# Patient Record
Sex: Female | Born: 1980 | Race: White | Hispanic: No | Marital: Married | Smoking: Never smoker
Health system: Southern US, Community
[De-identification: ages and names within clinical notes are randomized; demographics above are authoritative.]

## PROBLEM LIST (undated history)

## (undated) DIAGNOSIS — Z789 Other specified health status: Secondary | ICD-10-CM

---

## 2004-07-30 ENCOUNTER — Emergency Department: Payer: Self-pay | Admitting: Emergency Medicine

## 2008-01-27 ENCOUNTER — Encounter: Payer: Self-pay | Admitting: Maternal and Fetal Medicine

## 2008-07-19 ENCOUNTER — Observation Stay: Payer: Self-pay

## 2008-07-22 ENCOUNTER — Inpatient Hospital Stay: Payer: Self-pay | Admitting: Obstetrics and Gynecology

## 2017-07-14 NOTE — L&D Delivery Note (Signed)
Delivery Note  Date of delivery: 02/05/2018 Estimated Date of Delivery: 01/31/18 Patient's last menstrual period was 05/07/2017. EGA: 255w5d  First Stage: Labor onset: 0300 02/05/18 Augmentation: AROM forebag, pitocin Analgesia Eliezer Lofts/Anesthesia intrapartum: IV Stadol, epidural SROM at 0300 02/05/18, AROM forebag 0855 02/05/18  Valerie Hanson presented to L&D with SROM labor. She was augmented with pitocin and AROM of forebag. Epidural placed for pain relief. She had recurrent variable and late decels, with IVF bolus, maternal oxygen, repositioning, and IUPC with AI used for resuscitation.   Second Stage: Complete dilation at 1840 Onset of pushing at 1845 FHR second stage: category II for variables and tachycardia Delivery at 1944 on 02/05/2018  She progressed to complete and had a spontaneous vaginal birth of a live female over an intact perineum. The fetal head was delivered in ROP position with restitution to LOT. One loose loop of cord around baby's right arm. Anterior then posterior shoulders delivered spontaneously. Baby placed on mom's abdomen and attended to by transition RN. Cord clamped and cut after 60 second delay by father of the baby. Cord segment given to nurse for gases. Cord blood obtained for newborn labs.  Third Stage: Placenta delivered spontaneously intact with 3VC at 1953 Placenta disposition: routine disposal Uterine tone firm / bleeding scant IV pitocin given for hemorrhage prophylyaxis  2nd degree perineal laceration identified  Anesthesia for repair: epidural Repair: 3-0 Vicryl Rapide CT Est. Blood Loss (mL): 250  Complications: none  Mom to postpartum.  Baby to Couplet care / Skin to Skin.  Newborn: Birth Weight: 7lb 15oz 3600g  Apgar Scores: 8, 9 Feeding planned: formula   Valerie Hanson, CNM 02/05/2018 8:26 PM

## 2017-07-21 ENCOUNTER — Other Ambulatory Visit: Payer: Self-pay | Admitting: Advanced Practice Midwife

## 2017-07-21 DIAGNOSIS — Z369 Encounter for antenatal screening, unspecified: Secondary | ICD-10-CM

## 2017-08-06 ENCOUNTER — Other Ambulatory Visit: Payer: Self-pay | Admitting: Advanced Practice Midwife

## 2017-08-06 ENCOUNTER — Ambulatory Visit
Admission: RE | Admit: 2017-08-06 | Discharge: 2017-08-06 | Disposition: A | Discharge status: Home / Self Care | Payer: BLUE CROSS/BLUE SHIELD | Source: Ambulatory Visit | Attending: Maternal & Fetal Medicine | Admitting: Maternal & Fetal Medicine

## 2017-08-06 ENCOUNTER — Ambulatory Visit (HOSPITAL_BASED_OUTPATIENT_CLINIC_OR_DEPARTMENT_OTHER)
Admission: RE | Admit: 2017-08-06 | Discharge: 2017-08-06 | Disposition: A | Discharge status: Home / Self Care | Payer: BLUE CROSS/BLUE SHIELD | Source: Ambulatory Visit | Attending: Maternal & Fetal Medicine | Admitting: Maternal & Fetal Medicine

## 2017-08-06 VITALS — BP 126/68 | HR 90 | Temp 99.1°F | Resp 18 | Wt 153.0 lb

## 2017-08-06 DIAGNOSIS — Z369 Encounter for antenatal screening, unspecified: Secondary | ICD-10-CM

## 2017-08-06 DIAGNOSIS — O09522 Supervision of elderly multigravida, second trimester: Secondary | ICD-10-CM | POA: Insufficient documentation

## 2017-08-06 DIAGNOSIS — Z3A14 14 weeks gestation of pregnancy: Secondary | ICD-10-CM | POA: Insufficient documentation

## 2017-08-06 HISTORY — DX: Other specified health status: Z78.9

## 2017-08-06 NOTE — Progress Notes (Signed)
Patient seen by me, I agree with the assessment and plan outlined by CGC Wells.  

## 2017-08-06 NOTE — Progress Notes (Signed)
Referring physician:  [facility] Length of Consultation: 40 minutes  Valerie Hanson was referred to [facility] for genetic counseling because of advanced maternal age.  The patient will be 37 years old at the time of delivery.  This note summarizes the information we discussed.    We explained that the chance of a chromosome abnormality increases with maternal age.  Chromosomes and examples of chromosome problems were reviewed.  Humans typically have 46 chromosomes in each cell, with half passed through each sperm and egg.  Any change in the number or structure of chromosomes can increase the risk of problems in the physical and mental development of a pregnancy.   Based upon age of the patient, the chance of any chromosome abnormality was 1 in 23. The chance of Down syndrome, the most common chromosome problem associated with maternal age, was 1 in 86.  The risk of chromosome problems is in addition to the 3% general population risk for birth defects and mental retardation.  The greatest chance, of course, is that the baby would be born in good health.  We discussed the following prenatal screening and testing options for this pregnancy:  First trimester screening, which includes nuchal translucency ultrasound screen and first trimester maternal serum marker screening.  The nuchal translucency has approximately an 80% detection rate for Down syndrome and can be positive for other chromosome abnormalities as well as heart defects.  When combined with a maternal serum marker screening, the detection rate is up to 90% for Down syndrome and up to 97% for trisomy 18.     The chorionic villus sampling procedure is available for first trimester chromosome analysis.  This involves the withdrawal of a small amount of chorionic villi (tissue from the developing placenta).  Risk of pregnancy loss is estimated to be approximately 1 in 200 to 1 in 100 (0.5 to 1%).  There is  approximately a 1% (1 in 100) chance that the CVS chromosome results will be unclear.  Chorionic villi cannot be tested for neural tube defects.     Maternal serum marker screening, a blood test that measures pregnancy proteins, can provide risk assessments for Down syndrome, trisomy 18, and open neural tube defects (spina bifida, anencephaly). Because it does not directly examine the fetus, it cannot positively diagnose or rule out these problems.  Targeted ultrasound uses high frequency sound waves to create an image of the developing fetus.  An ultrasound is often recommended as a routine means of evaluating the pregnancy.  It is also used to screen for fetal anatomy problems (for example, a heart defect) that might be suggestive of a chromosomal or other abnormality.   Amniocentesis involves the removal of a small amount of amniotic fluid from the sac surrounding the fetus with the use of a thin needle inserted through the maternal abdomen and uterus.  Ultrasound guidance is used throughout the procedure.  Fetal cells from amniotic fluid are directly evaluated and > 99.5% of chromosome problems and > 98% of open neural tube defects can be detected. This procedure is generally performed after the 15th week of pregnancy.  The main risks to this procedure include complications leading to miscarriage in less than 1 in 200 cases (0.5%).  We also reviewed the availability of cell free fetal DNA testing from maternal blood to determine whether or not the baby may have either Down syndrome, trisomy 59, or trisomy 77.  This test utilizes a maternal blood sample and DNA  sequencing technology to isolate circulating cell free fetal DNA from maternal plasma.  The fetal DNA can then be analyzed for DNA sequences that are derived from the three most common chromosomes involved in aneuploidy, chromosomes 13, 18, and 21.  If the overall amount of DNA is greater than the expected level for any of these chromosomes,  aneuploidy is suspected.  While we do not consider it a replacement for invasive testing and karyotype analysis, a negative result from this testing would be reassuring, though not a guarantee of a normal chromosome complement for the baby.  An abnormal result is certainly suggestive of an abnormal chromosome complement, though we would still recommend CVS or amniocentesis to confirm any findings from this testing.  Cystic Fibrosis and Spinal Muscular Atrophy (SMA) screening were also discussed with the patient. Both conditions are recessive, which means that both parents must be carriers in order to have a child with the disease.  Cystic fibrosis (CF) is one of the most common genetic conditions in persons of Caucasian ancestry.  This condition occurs in approximately 1 in 2,500 Caucasian persons and results in thickened secretions in the lungs, digestive, and reproductive systems.  For a baby to be at risk for having CF, both of the parents must be carriers for this condition.  Approximately 1 in 2625 Caucasian persons is a carrier for CF.  Current carrier testing looks for the most common mutations in the gene for CF and can detect approximately 90% of carriers in the Caucasian population.  This means that the carrier screening can greatly reduce, but cannot eliminate, the chance for an individual to have a child with CF.  If an individual is found to be a carrier for CF, then carrier testing would be available for the partner. As part of [place] newborn screening profile, all babies born in the state of [place] will have a two-tier screening process.  Specimens are first tested to determine the concentration of immunoreactive trypsinogen (IRT).  The top 5% of specimens with the highest IRT values then undergo DNA testing using a panel of over 40 common CF mutations. SMA is a neurodegenerative disorder that leads to atrophy of skeletal muscle and overall weakness.  This condition is also more  prevalent in the Caucasian population, with 1 in 40-1 in 60 persons being a carrier and 1 in 6,000-1 in 10,000 children being affected.  There are multiple forms of the disease, with some causing death in infancy to other forms with survival into adulthood.  The genetics of SMA is complex, but carrier screening can detect up to 95% of carriers in the Caucasian population.  Similar to CF, a negative result can greatly reduce, but cannot eliminate, the chance to have a child with SMA.  We obtained a detailed family history and pregnancy history.  The father of the baby reported one brother and one maternal uncle with kidney failure at a young age.  His brother required a kidney transplant at age 37 years.  He recalls the family was told the condition is inherited, but he does not have any information about the specific diagnosis or name for the condition. He knows that his kidneys are normal, as he had kidney stones and therefore had evaluations of his kidneys.  We discussed that there may be various types of kidney conditions with different types of inheritance, but that without additional medical information, a recurrence risk estimate for this pregnancy is difficult.  The remainder of the family history  is unremarkable for birth defects, developmental delays, recurrent pregnancy loss or known chromosome abnormalities.  Ms. Sameen Leas stated that this is her third pregnancy.  The couple has a healthy son and daughter.  She reported no complications or exposures that would be expected to increase the risk for birth defects in this pregnancy.  After consideration of the options, Ms. Solis Merkel elected to proceed with MaterniT21 PLUS with [facility].  She declined CF and SMA carrier screening.  Hemoglobinopathy screening was normal at [facility].  An ultrasound was performed at the time of the visit.  The gestational age was consistent with 14 weeks.  Fetal anatomy could not be assessed due to early gestational age.   Please refer to the ultrasound report for details of that study. She was scheduled to return in 4 weeks for a detailed anatomy ultrasound.  Ms. Sharina Petre was encouraged to call with questions or concerns.  We can be contacted at [phone].   Tests Ordered:  MaterniT21 PLUS with [facility]  Cherly Anderson, MS, CGC

## 2017-08-10 LAB — MATERNIT21 PLUS CORE+SCA
Chromosome 13: NEGATIVE
Chromosome 18: NEGATIVE
Chromosome 21: NEGATIVE
Y CHROMOSOME: NOT DETECTED

## 2017-08-13 ENCOUNTER — Telehealth: Payer: Self-pay | Admitting: Obstetrics and Gynecology

## 2017-08-13 NOTE — Telephone Encounter (Signed)
The patient was informed of the results of her recent MaterniT21 testing which yielded NEGATIVE results.  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  The sensitivity for trisomy 4221, trisomy 2518 and trisomy 2213 using this testing are reported as 99.1%, 99.9% and 91.7% respectively.  Thus, while the results of this testing are highly accurate, they are not considered diagnostic at this time.  Should more definitive information be desired, the patient may still consider amniocentesis.   As requested to know by the patient, sex chromosome analysis was included for this sample.  Results was consistent with a female fetus (no Y chromosome was detected). This is predicted with >99% accuracy.  A maternal serum AFP only should be considered if screening for neural tube defects is desired.   Cherly Andersoneborah F. Chaise Mahabir, MS, CGC

## 2017-08-31 ENCOUNTER — Other Ambulatory Visit: Payer: Self-pay | Admitting: *Deleted

## 2017-08-31 DIAGNOSIS — O09522 Supervision of elderly multigravida, second trimester: Secondary | ICD-10-CM

## 2017-09-03 ENCOUNTER — Ambulatory Visit
Admission: RE | Admit: 2017-09-03 | Discharge: 2017-09-03 | Disposition: A | Discharge status: Home / Self Care | Payer: BLUE CROSS/BLUE SHIELD | Source: Ambulatory Visit | Attending: Obstetrics & Gynecology | Admitting: Obstetrics & Gynecology

## 2017-09-03 VITALS — BP 117/67 | HR 88 | Temp 98.3°F | Resp 18 | Wt 155.6 lb

## 2017-09-03 DIAGNOSIS — O321XX0 Maternal care for breech presentation, not applicable or unspecified: Secondary | ICD-10-CM | POA: Insufficient documentation

## 2017-09-03 DIAGNOSIS — Z3A18 18 weeks gestation of pregnancy: Secondary | ICD-10-CM | POA: Diagnosis not present

## 2017-09-03 DIAGNOSIS — O09522 Supervision of elderly multigravida, second trimester: Secondary | ICD-10-CM | POA: Diagnosis present

## 2017-11-09 ENCOUNTER — Ambulatory Visit
Admission: RE | Admit: 2017-11-09 | Discharge: 2017-11-09 | Disposition: A | Discharge status: Home / Self Care | Payer: BLUE CROSS/BLUE SHIELD | Source: Ambulatory Visit | Attending: Maternal & Fetal Medicine | Admitting: Maternal & Fetal Medicine

## 2017-11-09 DIAGNOSIS — O09522 Supervision of elderly multigravida, second trimester: Secondary | ICD-10-CM

## 2017-11-09 DIAGNOSIS — Z3A28 28 weeks gestation of pregnancy: Secondary | ICD-10-CM | POA: Diagnosis not present

## 2018-02-02 ENCOUNTER — Other Ambulatory Visit: Payer: Self-pay | Admitting: Obstetrics and Gynecology

## 2018-02-02 ENCOUNTER — Other Ambulatory Visit: Payer: Self-pay | Admitting: Certified Nurse Midwife

## 2018-02-05 ENCOUNTER — Inpatient Hospital Stay: Payer: BLUE CROSS/BLUE SHIELD | Admitting: Anesthesiology

## 2018-02-05 ENCOUNTER — Other Ambulatory Visit: Payer: Self-pay

## 2018-02-05 ENCOUNTER — Inpatient Hospital Stay
Admission: EM | Admit: 2018-02-05 | Discharge: 2018-02-07 | DRG: 806 | Disposition: A | Discharge status: Home / Self Care | Payer: BLUE CROSS/BLUE SHIELD | Attending: Certified Nurse Midwife | Admitting: Certified Nurse Midwife

## 2018-02-05 DIAGNOSIS — D62 Acute posthemorrhagic anemia: Secondary | ICD-10-CM | POA: Diagnosis not present

## 2018-02-05 DIAGNOSIS — O480 Post-term pregnancy: Secondary | ICD-10-CM | POA: Diagnosis present

## 2018-02-05 DIAGNOSIS — O9081 Anemia of the puerperium: Secondary | ICD-10-CM | POA: Diagnosis not present

## 2018-02-05 DIAGNOSIS — Z3A40 40 weeks gestation of pregnancy: Secondary | ICD-10-CM | POA: Diagnosis not present

## 2018-02-05 DIAGNOSIS — Z7982 Long term (current) use of aspirin: Secondary | ICD-10-CM

## 2018-02-05 DIAGNOSIS — Z3483 Encounter for supervision of other normal pregnancy, third trimester: Secondary | ICD-10-CM | POA: Diagnosis present

## 2018-02-05 LAB — CBC
HEMATOCRIT: 36.8 % (ref 35.0–47.0)
HEMOGLOBIN: 12.5 g/dL (ref 12.0–16.0)
MCH: 30.3 pg (ref 26.0–34.0)
MCHC: 34.1 g/dL (ref 32.0–36.0)
MCV: 89.1 fL (ref 80.0–100.0)
Platelets: 258 10*3/uL (ref 150–440)
RBC: 4.13 MIL/uL (ref 3.80–5.20)
RDW: 14.8 % — ABNORMAL HIGH (ref 11.5–14.5)
WBC: 9.7 10*3/uL (ref 3.6–11.0)

## 2018-02-05 MED ORDER — COCONUT OIL OIL: 1.0000 "application " | TOPICAL_OIL | Status: DC | PRN

## 2018-02-05 MED ORDER — SENNOSIDES-DOCUSATE SODIUM 8.6-50 MG PO TABS
2.0000 | ORAL_TABLET | ORAL | Status: DC
  Administered 2018-02-06 – 2018-02-07 (×2): 2 via ORAL
  Filled 2018-02-05 (×3): qty 2

## 2018-02-05 MED ORDER — OXYTOCIN 10 UNIT/ML IJ SOLN
INTRAMUSCULAR | Status: AC
  Filled 2018-02-05: qty 2

## 2018-02-05 MED ORDER — WITCH HAZEL-GLYCERIN EX PADS: 1.0000 "application " | MEDICATED_PAD | CUTANEOUS | Status: DC | PRN

## 2018-02-05 MED ORDER — LACTATED RINGERS IV SOLN
500.0000 mL | INTRAVENOUS | Status: DC | PRN
  Administered 2018-02-05 (×2): 1000 mL via INTRAVENOUS

## 2018-02-05 MED ORDER — BUPIVACAINE HCL (PF) 0.25 % IJ SOLN
INTRAMUSCULAR | Status: DC | PRN
  Administered 2018-02-05 (×2): 5 mL via EPIDURAL

## 2018-02-05 MED ORDER — BUTORPHANOL TARTRATE 2 MG/ML IJ SOLN
1.0000 mg | INTRAMUSCULAR | Status: DC | PRN
  Administered 2018-02-05 (×2): 1 mg via INTRAVENOUS
  Filled 2018-02-05 (×2): qty 1

## 2018-02-05 MED ORDER — OXYTOCIN 40 UNITS IN LACTATED RINGERS INFUSION - SIMPLE MED
1.0000 m[IU]/min | INTRAVENOUS | Status: DC
  Administered 2018-02-05: 1 m[IU]/min via INTRAVENOUS

## 2018-02-05 MED ORDER — ONDANSETRON HCL 4 MG/2ML IJ SOLN: 4.0000 mg | Freq: Four times a day (QID) | INTRAMUSCULAR | Status: DC | PRN

## 2018-02-05 MED ORDER — FENTANYL 2.5 MCG/ML W/ROPIVACAINE 0.15% IN NS 100 ML EPIDURAL (ARMC)
EPIDURAL | Status: AC
  Filled 2018-02-05: qty 100

## 2018-02-05 MED ORDER — PRENATAL MULTIVITAMIN CH
1.0000 | ORAL_TABLET | Freq: Every day | ORAL | Status: DC
  Administered 2018-02-06: 1 via ORAL
  Filled 2018-02-05: qty 1

## 2018-02-05 MED ORDER — IBUPROFEN 600 MG PO TABS
600.0000 mg | ORAL_TABLET | Freq: Four times a day (QID) | ORAL | Status: DC
  Administered 2018-02-05 – 2018-02-07 (×7): 600 mg via ORAL
  Filled 2018-02-05 (×7): qty 1

## 2018-02-05 MED ORDER — MISOPROSTOL 25 MCG QUARTER TABLET: 25.0000 ug | ORAL_TABLET | ORAL | Status: DC

## 2018-02-05 MED ORDER — LIDOCAINE HCL (PF) 1 % IJ SOLN
INTRAMUSCULAR | Status: DC | PRN
  Administered 2018-02-05: 3 mL

## 2018-02-05 MED ORDER — OXYTOCIN 40 UNITS IN LACTATED RINGERS INFUSION - SIMPLE MED
2.5000 [IU]/h | INTRAVENOUS | Status: DC
  Administered 2018-02-05: 2.5 [IU]/h via INTRAVENOUS

## 2018-02-05 MED ORDER — AMMONIA AROMATIC IN INHA
RESPIRATORY_TRACT | Status: AC
  Filled 2018-02-05: qty 10

## 2018-02-05 MED ORDER — OXYTOCIN 40 UNITS IN LACTATED RINGERS INFUSION - SIMPLE MED
INTRAVENOUS | Status: AC
  Administered 2018-02-05: 1 m[IU]/min via INTRAVENOUS
  Filled 2018-02-05: qty 1000

## 2018-02-05 MED ORDER — FENTANYL 2.5 MCG/ML W/ROPIVACAINE 0.15% IN NS 100 ML EPIDURAL (ARMC)
EPIDURAL | Status: DC | PRN
  Administered 2018-02-05: 12 mL/h via EPIDURAL

## 2018-02-05 MED ORDER — ACETAMINOPHEN 325 MG PO TABS
650.0000 mg | ORAL_TABLET | ORAL | Status: DC | PRN
  Administered 2018-02-06 – 2018-02-07 (×4): 650 mg via ORAL
  Filled 2018-02-05 (×4): qty 2

## 2018-02-05 MED ORDER — OXYTOCIN BOLUS FROM INFUSION
500.0000 mL | Freq: Once | INTRAVENOUS | Status: AC
  Administered 2018-02-05: 500 mL via INTRAVENOUS

## 2018-02-05 MED ORDER — LIDOCAINE-EPINEPHRINE (PF) 1.5 %-1:200000 IJ SOLN
INTRAMUSCULAR | Status: DC | PRN
  Administered 2018-02-05: 3 mL via PERINEURAL

## 2018-02-05 MED ORDER — MISOPROSTOL 200 MCG PO TABS
ORAL_TABLET | ORAL | Status: AC
  Filled 2018-02-05: qty 4

## 2018-02-05 MED ORDER — DIBUCAINE 1 % RE OINT: 1.0000 "application " | TOPICAL_OINTMENT | RECTAL | Status: DC | PRN

## 2018-02-05 MED ORDER — SIMETHICONE 80 MG PO CHEW: 160.0000 mg | CHEWABLE_TABLET | Freq: Four times a day (QID) | ORAL | Status: DC | PRN

## 2018-02-05 MED ORDER — SOD CITRATE-CITRIC ACID 500-334 MG/5ML PO SOLN: 30.0000 mL | ORAL | Status: DC | PRN

## 2018-02-05 MED ORDER — LACTATED RINGERS AMNIOINFUSION
INTRAVENOUS | Status: DC
  Filled 2018-02-05 (×3): qty 1000

## 2018-02-05 MED ORDER — ONDANSETRON HCL 4 MG PO TABS: 4.0000 mg | ORAL_TABLET | ORAL | Status: DC | PRN

## 2018-02-05 MED ORDER — SODIUM CHLORIDE FLUSH 0.9 % IV SOLN
INTRAVENOUS | Status: AC
  Filled 2018-02-05: qty 10

## 2018-02-05 MED ORDER — LACTATED RINGERS IV SOLN
INTRAVENOUS | Status: DC
  Administered 2018-02-05 (×2): via INTRAVENOUS

## 2018-02-05 MED ORDER — ONDANSETRON HCL 4 MG/2ML IJ SOLN: 4.0000 mg | INTRAMUSCULAR | Status: DC | PRN

## 2018-02-05 MED ORDER — LIDOCAINE HCL (PF) 1 % IJ SOLN
INTRAMUSCULAR | Status: AC
  Filled 2018-02-05: qty 30

## 2018-02-05 MED ORDER — ACETAMINOPHEN 325 MG PO TABS: 650.0000 mg | ORAL_TABLET | ORAL | Status: DC | PRN

## 2018-02-05 MED ORDER — BENZOCAINE-MENTHOL 20-0.5 % EX AERO
1.0000 "application " | INHALATION_SPRAY | CUTANEOUS | Status: DC | PRN
  Filled 2018-02-05: qty 56

## 2018-02-05 MED ORDER — LIDOCAINE HCL (PF) 1 % IJ SOLN: 30.0000 mL | INTRAMUSCULAR | Status: DC | PRN

## 2018-02-05 NOTE — Discharge Summary (Signed)
Obstetric Discharge Summary   Patient ID: Patient Name: Valerie Hanson DOB: [birth year] MRN: [id number]  Date of Admission: 02/05/2018 Date of Delivery: 02/05/2018 Delivered by: Genia Del, CNM Date of Discharge: 02/07/2018  Primary OB: [facility] WUJ:[id number] last menstrual period was 05/07/2017. EDC Estimated Date of Delivery: 01/31/18 Gestational Age at Delivery: [redacted]w[redacted]d   Antepartum complications:  -Elevated 1h OGTT, normal 3h OTT -Hx of PreEclampsia: on baby ASA -AMA  Admitting Diagnosis: SROM labor  Secondary Diagnoses: Patient Active Problem List   Diagnosis Date Noted  . Post-term pregnancy, 40-42 weeks of gestation 02/05/2018  . Advanced maternal age in multigravida, second trimester     Augmentation: AROM and Pitocin Complications: None Intrapartum complications/course: Valerie Hanson presented to L&D with SROM labor. She was augmented with pitocin and AROM of forebag. Epidural placed for pain relief. She had recurrent variable and late decels, with IVF bolus, maternal oxygen, repositioning, and IUPC with AI used for resuscitation. She progressed to complete and had a spontaneous vaginal birth of a live female over an intact perineum. The fetal head was delivered in ROP position with restitution to LOT. One loose loop of cord around baby's right arm. Anterior then posterior shoulders delivered spontaneously. Baby placed on mom's abdomen and attended to by transition RN. Cord clamped and cut after 60 second delay by father of the baby. Cord segment given to nurse for gases. Cord blood obtained for newborn labs.  Delivery Type: spontaneous vaginal delivery Anesthesia: epidural Placenta: sponatneous Laceration: 2nd degree perineal, repaired Episiotomy: none  Newborn Data: Live born female "Valerie Hanson" Birth Weight: 7 lb 15 oz (3600 g) APGAR: 8, 9   Newborn Delivery   Birth date/time:  02/05/2018 19:44:00 Delivery type:  Vaginal, Spontaneous     Postpartum  Course  Patient had an uncomplicated postpartum course.  By time of discharge on PPD#2, her pain was controlled on oral pain medications; she had appropriate lochia and was ambulating, voiding without difficulty and tolerating regular diet.  She was deemed stable for discharge to home.       Labs: CBC Latest Ref Rng & Units 02/07/2018 02/06/2018 02/05/2018  WBC 3.6 - 11.0 K/uL 9.1 14.4(H) 9.7  Hemoglobin 12.0 - 16.0 g/dL 2.9(F) 10.5(L) 12.5  Hematocrit 35.0 - 47.0 % 28.3(L) 31.1(L) 36.8  Platelets 150 - 440 K/uL 186 215 258   O POS  Physical exam:  BP 99/69 (BP Location: Left Arm)   Pulse 81   Temp 98.2 F (36.8 C) (Oral)   Resp 20   Ht 5' (1.524 m)   Wt 78.5 kg (173 lb)   LMP 05/07/2017   SpO2 99%   Breastfeeding? Unknown   BMI 33.79 kg/m  General: alert and no distress Pulm: normal respiratory effort Lochia: appropriate Abdomen: soft, NT Uterine Fundus: firm, below umbilicus Extremities: No evidence of DVT seen on physical exam. No lower extremity edema.  Disposition: stable, discharge to home Baby Feeding: formula Baby Disposition: home with mom  Contraception: TBD, considering BTL  Prenatal Labs:  Blood type/Rh O+  Antibody screen neg  Rubella Immune  Varicella Immune  RPR NR  HBsAg Neg  HIV NR  GC neg  Chlamydia neg  Genetic screening negative  1 hour GTT 174  3 hour GTT 80, 161, 108,74  GBS negative    Rh Immune globulin given: n/a Rubella vaccine given: n/a Tdap vaccine given in AP or PP setting: 11/16/2017 Flu vaccine given in AP or PP setting: 07/21/2017  Plan:  [name]  [name] was discharged to home in good condition. [facility] with delivery provider in 6 weeks  Discharge Instructions: Per After Visit Summary. Activity: Advance as tolerated. Pelvic rest for 6 weeks.   Diet: Regular Discharge Medications: Allergies as of 02/07/2018   No Known Allergies     Medication List    TAKE these medications    acetaminophen 325 MG tablet Commonly known as:  TYLENOL Take 2 tablets (650 mg total) by mouth every 4 (four) hours as needed for mild pain or moderate pain.   aspirin 81 MG chewable tablet Chew by mouth daily.   ferrous sulfate 325 (65 FE) MG tablet Take 1 tablet (325 mg total) by mouth daily with breakfast.   ibuprofen 600 MG tablet Commonly known as:  ADVIL,MOTRIN Take 1 tablet (600 mg total) by mouth every 6 (six) hours as needed for mild pain, moderate pain or cramping.   prenatal multivitamin Tabs tablet Take 1 tablet by mouth daily at 12 noon.      Outpatient follow up:  Follow-up Information    Genia DelHaviland, Jhonathan Desroches, CNM. Schedule an appointment as soon as possible for a visit in 6 week(s).   Specialty:  Certified Nurse Midwife Why:  For routine postpartum visit Contact information: [address] [place] [address] [phone]           Signed:  Genia DelMargaret Lulamae Skorupski, CNM 02/07/2018 8:26 AM

## 2018-02-05 NOTE — Anesthesia Preprocedure Evaluation (Signed)
Anesthesia Evaluation  Patient identified by MRN, date of birth, ID band Patient awake    Reviewed: Allergy & Precautions, H&P , NPO status , Patient's Chart, lab work & pertinent test results  History of Anesthesia Complications Negative for: history of anesthetic complications  Airway Mallampati: II       Dental no notable dental hx.    Pulmonary neg pulmonary ROS,    Pulmonary exam normal        Cardiovascular negative cardio ROS Normal cardiovascular exam     Neuro/Psych negative neurological ROS  negative psych ROS   GI/Hepatic negative GI ROS, Neg liver ROS,   Endo/Other  negative endocrine ROS  Renal/GU negative Renal ROS  negative genitourinary   Musculoskeletal   Abdominal   Peds  Hematology negative hematology ROS (+)   Anesthesia Other Findings   Reproductive/Obstetrics (+) Pregnancy                             Anesthesia Physical Anesthesia Plan  ASA: II  Anesthesia Plan:    Post-op Pain Management:    Induction:   PONV Risk Score and Plan:   Airway Management Planned:   Additional Equipment:   Intra-op Plan:   Post-operative Plan:   Informed Consent: I have reviewed the patients History and Physical, chart, labs and discussed the procedure including the risks, benefits and alternatives for the proposed anesthesia with the patient or authorized representative who has indicated his/her understanding and acceptance.     Plan Discussed with: CRNA  Anesthesia Plan Comments:         Anesthesia Quick Evaluation

## 2018-02-05 NOTE — Progress Notes (Signed)
Labor Progress Note  Valerie Hanson is a 37 y.o. G3P2002 at 4220w5d by 8247w4d ultrasound admitted for rupture of membranes  Subjective:  Feeling pain from contractions. Desires another dose of Stadol and waiting for it to come up from pharmacy.   Objective: BP 112/76 (BP Location: Left Arm)   Pulse 79   Temp 98.4 F (36.9 C) (Oral)   Resp 16   Ht 5' (1.524 m)   Wt 78.5 kg (173 lb)   LMP 05/07/2017   SpO2 98%   BMI 33.79 kg/m   Fetal Assessment: FHT:  FHR: 135 bpm, variability: moderate,  accelerations:  Present,  decelerations: Present early, variable, late Category/reactivity:  Category I and Category II UC:   regular, every 2-4 minutes SVE:  Per RN Jacqlyn LarsenBrittney Nielsen at 1245 Dilation: 7cm  Effacement: 70%  Station:  -2  Consistency: medium  Position: middle  Membrane status: SROM 02/05/18 0300, AROM forebag 02/05/18 0855 Amniotic color: clear  Labs: Lab Results  Component Value Date   WBC 9.7 02/05/2018   HGB 12.5 02/05/2018   HCT 36.8 02/05/2018   MCV 89.1 02/05/2018   PLT 258 02/05/2018    Assessment / Plan: Spontaneous labor, progressing normally  Labor: Progressing slowly, Pitocin currently infusing at 427mu/min, continue to titrate per protocol  Fetal Wellbeing:  Category II, overall reassuring, but will give IVF bolus now and oxygen PRN Pain Control:  IV pain meds Anticipated MOD:  NSVD  Genia DelMargaret Damonte Frieson, CNM 02/05/2018, 1:06 PM

## 2018-02-05 NOTE — Progress Notes (Signed)
Labor Progress Note  Valerie Hanson is a 37 y.o. G3P2002 at 1639w5d by 6364w4d ultrasound admitted for rupture of membranes  Subjective:  Feeling a lot of relief with the epidural in place. Has one patch where she is feeling pain, but is overall feeling much better. Laying on right side with peanut ball in place.   Objective: BP 107/67   Pulse 87   Temp 98.5 F (36.9 C) (Oral)   Resp 16   Ht 5' (1.524 m)   Wt 78.5 kg (173 lb)   LMP 05/07/2017   SpO2 100%   BMI 33.79 kg/m   Fetal Assessment: FHT: 135 bpm, variability: moderate,  accelerations:  Present,  decelerations: Present variable, late Category/reactivity:  Category II UC:   regular, every 2-4 minutes SVE:  Per RN Jacqlyn LarsenBrittney Nielsen at 1245 Dilation: 8.5cm  Effacement: 90%  Station:  -1  Consistency: soft  Position: middle  Membrane status: SROM 02/05/18 0300, AROM forebag 02/05/18 0855 Amniotic color: clear  Labs: Lab Results  Component Value Date   WBC 9.7 02/05/2018   HGB 12.5 02/05/2018   HCT 36.8 02/05/2018   MCV 89.1 02/05/2018   PLT 258 02/05/2018    Assessment / Plan: Augmentation of labor, prolonged active phase  Labor: Progressing slowly, Pitocin currently infusing at 254mu/min, stopped and restarted at half after earlier prolonged decel, IUPC placed to titrate pitocin Fetal Wellbeing:  Category II, overall reassuring, but with 4 minute prolonged deceleration and regular variable decles Pain Control:  Epidural Anticipated MOD:  NSVD   Dr. Dalbert GarnetBeasley aware of current plan.   Genia DelMargaret Tonetta Napoles, CNM 02/05/2018, 4:31 PM

## 2018-02-05 NOTE — H&P (Signed)
OB ADMISSION/ HISTORY & PHYSICAL:  Admission Date: 02/05/2018  3:55 AM  Admit Diagnosis: SROM  Valerie Hanson is a 37 y.o. female presenting for active labor with ruptured membranes.  Prenatal History: [id number] EDC : 01/31/2018, by Ultrasound  Prenatal care at [facility] Prenatal course complicated by  Abnormal glucola, normal 3 hr Hx of PreEclampsia: on baby ASA   Medical / Surgical History :  Past medical history:  Past Medical History:  Diagnosis Date  . Medical history non-contributory      Past surgical history: History reviewed. No pertinent surgical history.  Family History: No family history on file.   Social History:  reports that she has never smoked. She has never used smokeless tobacco. She reports that she does not drink alcohol or use drugs.   Allergies: Patient has no known allergies.    Current Medications at time of admission:  Prior to Admission medications   Medication Sig Start Date End Date Taking? Authorizing Provider  aspirin 81 MG chewable tablet Chew by mouth daily.   Yes [provider]  Prenatal Vit-Fe Fumarate-FA (PRENATAL MULTIVITAMIN) TABS tablet Take 1 tablet by mouth daily at 12 noon.   Yes [provider]     Review of Systems: Active FM onset of ctx @ 3am currently every 3 minutes LOF  / SROM: clear fluid at 0300 bloody show yes   Physical Exam:  VS: Blood pressure 117/67, pulse 86, temperature 98.4 F (36.9 C), temperature source Oral, resp. rate 16, height 5' (1.524 m), weight 78.5 kg (173 lb), last menstrual period 05/07/2017.  General: alert and oriented, appears in mod distress Heart: RRR Lungs: Clear lung fields Abdomen: Gravid, soft and non-tender, non-distended / uterus: non tender Extremities: no edema  FHT: 120, moderate variability, +accels, no decels TOCO:q3 min SVE:  Dilation: 4 / Effacement (%): 70 / Station: -2    Cephalic by leopolds  Prenatal Labs: Blood type/Rh    Antibody screen  neg  Rubella Immune  Varicella Immune  RPR NR  HBsAg Neg  HIV NR  GC neg  Chlamydia neg  Genetic screening negative  1 hour GTT 174  3 hour GTT 80, 161, 108,74  GBS negative   No results found.  Assessment: 40+[redacted] weeks gestation 1 stage of labor FHR category 1   Plan:   Admit for active labor Labs pending Epidural if desired Continuous fetal monitoring   1. Fetal Well being  - Fetal Tracing: Cat I - Group B Streptococcus: neg - Presentation: vtx confirmed by Leopolds   2. Routine OB: - Prenatal labs reviewed, as above - Rh O positive   3. Post Partum Planning: - Infant feeding: Formula - Contraception: Postpartum BTL

## 2018-02-05 NOTE — Progress Notes (Signed)
Labor Progress Note  Valerie KatzMaria D Solis Hanson is a 37 y.o. G3P2002 at 7050w5d by 7638w4d ultrasound admitted for rupture of membranes  Subjective:  Laying on left side with peanut ball in place.   Objective: BP 107/67   Pulse 87   Temp 98 F (36.7 C) (Oral)   Resp 16   Ht 5' (1.524 m)   Wt 78.5 kg (173 lb)   LMP 05/07/2017   SpO2 95%   BMI 33.79 kg/m   Fetal Assessment: FHT: 135 bpm, variability: moderate,  accelerations:  Present,  decelerations: Present variable, late Category/reactivity:  Category II UC:   regular, every 3-7 minutes SVE:  Per RN Jacqlyn LarsenBrittney Nielsen at 1245 Dilation: 9cm  Effacement: 100%  Station:  0  Consistency: soft  Position: middle  Membrane status: SROM 02/05/18 0300, AROM forebag 02/05/18 0855 Amniotic color: clear  Labs: Lab Results  Component Value Date   WBC 9.7 02/05/2018   HGB 12.5 02/05/2018   HCT 36.8 02/05/2018   MCV 89.1 02/05/2018   PLT 258 02/05/2018    Assessment / Plan: Augmentation of labor, prolonged active phase  Labor: Progressing slowly, Pitocin turned off due to recurrent decelerations, IUPC remains in place Fetal Wellbeing:  Category II, overall reassuring, recurrent decels improved with cessation of Pitocin, amnioinfusion of 250mL with continuous infusion of 16525mL/hr, 10L O2 by mask Pain Control:  Epidural Anticipated MOD:  NSVD    Discussed patient and tracing with Dr. Dalbert GarnetBeasley, who states that she will come to evaluate the patient and tracing in person as soon as she is out of the operating room downstairs.    Genia DelMargaret Selim Durden, CNM 02/05/2018, 5:54 PM

## 2018-02-05 NOTE — Anesthesia Procedure Notes (Signed)
Epidural Patient location during procedure: OB Start time: 02/05/2018 2:08 PM End time: 02/05/2018 2:24 PM  Staffing Resident/CRNA: Junious SilkNoles, Marisol Giambra, CRNA Performed: resident/CRNA   Preanesthetic Checklist Completed: patient identified, site marked, surgical consent, pre-op evaluation, timeout performed, IV checked, risks and benefits discussed and monitors and equipment checked  Epidural Patient position: sitting Prep: Betadine Patient monitoring: heart rate, continuous pulse ox and blood pressure Approach: midline Location: L3-L4 Injection technique: LOR saline  Needle:  Needle type: Tuohy  Needle gauge: 17 G Needle length: 9 cm and 9 Catheter type: closed end flexible Catheter size: 20 Guage Test dose: negative and 1.5% lidocaine with Epi 1:200 K  Assessment Events: blood not aspirated, injection not painful, no injection resistance, negative IV test and no paresthesia  Additional Notes   Patient tolerated the insertion well without complications.Reason for block:procedure for pain

## 2018-02-05 NOTE — Plan of Care (Signed)
Reviewed plan of care with patient. All questions answered. Will continue to monitor closely. 

## 2018-02-05 NOTE — Progress Notes (Signed)
Labor Progress Note  Valerie Hanson is a 37 y.o. G3P2002 at 4125w5d by 5823w4d ultrasound admitted for rupture of membranes  Subjective:  Feeling contractions strongly, got some relief from IV Stadol.   Objective: BP 108/77   Pulse 86   Temp 97.8 F (36.6 C) (Oral)   Resp 16   Ht 5' (1.524 m)   Wt 78.5 kg (173 lb)   LMP 05/07/2017   SpO2 98%   BMI 33.79 kg/m   Fetal Assessment: FHT:  FHR: 125 bpm, variability: moderate,  accelerations:  Present,  decelerations:  Present early Category/reactivity:  Category I UC:   regular, every 4-6 minutes SVE:    Dilation: 6cm  Effacement: 70%  Station:  -2  Consistency: medium  Position: middle  Membrane status: SROM 02/05/18 0300, AROM forebag 02/05/18 0855 Amniotic color: clear  Labs: Lab Results  Component Value Date   WBC 9.7 02/05/2018   HGB 12.5 02/05/2018   HCT 36.8 02/05/2018   MCV 89.1 02/05/2018   PLT 258 02/05/2018    Assessment / Plan: Spontaneous labor, progressing normally  Labor: Progressing normally but with contractions spacing out, encouraged to be upright and move around, plan to start Pitocin if contractions continue to space Fetal Wellbeing:  Category I Pain Control:  IV pain meds Anticipated MOD:  NSVD  Genia DelMargaret Modine Oppenheimer, CNM 02/05/2018, 8:58 AM

## 2018-02-05 NOTE — Progress Notes (Signed)
Labor Progress Note  Valerie Hanson is a 37 y.o. G3P2002 at 7684w5d by 7891w4d ultrasound admitted for rupture of membranes  Subjective:  Feeling a lot of relief with the epidural in place. Has one patch where she is feeling pain, but is overall feeling much better. Laying on right side with peanut ball in place.   Objective: BP 107/67   Pulse 87   Temp 98.5 F (36.9 C) (Oral)   Resp 16   Ht 5' (1.524 m)   Wt 78.5 kg (173 lb)   LMP 05/07/2017   SpO2 95%   BMI 33.79 kg/m   Fetal Assessment: FHT: 135 bpm, variability: moderate,  accelerations:  Present,  decelerations: Present variable, late Category/reactivity:  Category II UC:   regular, every 2-4 minutes SVE:  Per RN Valerie LarsenBrittney Hanson at 1245 Dilation: 9cm  Effacement: 100%  Station:  0  Consistency: soft  Position: middle  Membrane status: SROM 02/05/18 0300, AROM forebag 02/05/18 0855 Amniotic color: clear  Labs: Lab Results  Component Value Date   WBC 9.7 02/05/2018   HGB 12.5 02/05/2018   HCT 36.8 02/05/2018   MCV 89.1 02/05/2018   PLT 258 02/05/2018    Assessment / Plan: Augmentation of labor, prolonged active phase  Labor: Progressing slowly, Pitocin turned off due to recurrent decelerations, IUPC remains in place Fetal Wellbeing:  Category II, overall reassuring, recurrent decels improved with cessation of Pitocin, amnioinfusion of 250mL with continuous infusion of 17025mL/hr Pain Control:  Epidural Anticipated MOD:  NSVD    Valerie Hanson, CNM 02/05/2018, 5:19 PM

## 2018-02-05 NOTE — OB Triage Note (Signed)
Pt arrival to triage with c/o SROM around 0300.  Nitrazine positive.  Pt grossly ruptured with clear large amount of fluid.  Pt states she feels pressure and mild contractions.  Denies vaginal bleeding and is feeling baby move normally.  EFM and toco applied and assessing.

## 2018-02-06 LAB — TYPE AND SCREEN
ABO/RH(D): O POS
Antibody Screen: NEGATIVE

## 2018-02-06 LAB — CBC
HEMATOCRIT: 31.1 % — AB (ref 35.0–47.0)
Hemoglobin: 10.5 g/dL — ABNORMAL LOW (ref 12.0–16.0)
MCH: 30.2 pg (ref 26.0–34.0)
MCHC: 33.8 g/dL (ref 32.0–36.0)
MCV: 89.3 fL (ref 80.0–100.0)
Platelets: 215 10*3/uL (ref 150–440)
RBC: 3.48 MIL/uL — AB (ref 3.80–5.20)
RDW: 14.9 % — ABNORMAL HIGH (ref 11.5–14.5)
WBC: 14.4 10*3/uL — AB (ref 3.6–11.0)

## 2018-02-06 MED ORDER — FERROUS SULFATE 325 (65 FE) MG PO TABS
325.0000 mg | ORAL_TABLET | Freq: Every day | ORAL | Status: DC
  Administered 2018-02-06 – 2018-02-07 (×2): 325 mg via ORAL
  Filled 2018-02-06 (×2): qty 1

## 2018-02-06 NOTE — Progress Notes (Signed)
Post Partum Day 1  Subjective: Doing well, no concerns. Ambulating without difficulty, pain managed with PO meds, tolerating regular diet, and voiding without difficulty.   No fever/chills, chest pain, shortness of breath, nausea/vomiting, or leg pain. No nipple or breast pain.   Objective: BP 106/65 (BP Location: Left Arm)   Pulse 97   Temp 98.8 F (37.1 C) (Oral)   Resp 20   Ht 5' (1.524 m)   Wt 78.5 kg (173 lb)   LMP 05/07/2017   SpO2 99%   Breastfeeding? Unknown   BMI 33.79 kg/m    Physical Exam:  General: alert, cooperative, appears stated age and no distress Breasts: soft/nontender CV: RRR Pulm: nl effort, CTABL Abdomen: soft, non-tender, active bowel sounds Uterine Fundus: firm Lochia: appropriate DVT Evaluation: No evidence of DVT seen on physical exam. No cords or calf tenderness. No significant calf/ankle edema.  Recent Labs    02/05/18 0540 02/06/18 0534  HGB 12.5 10.5*  HCT 36.8 31.1*  WBC 9.7 14.4*  PLT 258 215    Assessment/Plan: 37 y.o. G3P3003 postpartum day # 1  -Continue routine PP care -Encouraged snug fitting bra and cabbage leaves for bottlefeeding.  -Considering BTL for contraception -Acute blood loss anemia - hemodynamically stable and asymptomatic, start daily iron supplement with stool softeners. -Immunization status: all immunizations up to date. -Plan for discharge home tomorrow morning.  Disposition: Continue inpatient postpartum care.     LOS: 1 day   Genia DelMargaret Tattianna Schnarr, CNM 02/06/2018, 9:00 AM   ----- Genia DelMargaret Aldena Worm Certified Nurse Midwife [facility]

## 2018-02-06 NOTE — Anesthesia Postprocedure Evaluation (Signed)
Anesthesia Post Note  Patient: Valerie Hanson  Procedure(s) Performed: AN AD HOC LABOR EPIDURAL  Patient location during evaluation: Mother Baby Anesthesia Type: Epidural Level of consciousness: awake and alert Pain management: pain level controlled Vital Signs Assessment: post-procedure vital signs reviewed and stable Respiratory status: spontaneous breathing, nonlabored ventilation and respiratory function stable Cardiovascular status: stable Postop Assessment: no headache, no backache, patient able to bend at knees and able to ambulate Anesthetic complications: no     Last Vitals:  Vitals:   02/06/18 0412 02/06/18 0841  BP: (!) 97/57 106/65  Pulse: 80 97  Resp: 16 20  Temp: 36.6 C 37.1 C  SpO2: 98% 99%    Last Pain:  Vitals:   02/06/18 0855  TempSrc:   PainSc: 6                  Cleda MccreedyJoseph K Oronde Hallenbeck

## 2018-02-06 NOTE — Plan of Care (Signed)
Discussed plan of care with patient. Patient verbalized understanding and agreed to plan. 

## 2018-02-07 LAB — RPR: RPR Ser Ql: NONREACTIVE

## 2018-02-07 LAB — CBC
HCT: 28.3 % — ABNORMAL LOW (ref 35.0–47.0)
HEMOGLOBIN: 9.6 g/dL — AB (ref 12.0–16.0)
MCH: 30.6 pg (ref 26.0–34.0)
MCHC: 34 g/dL (ref 32.0–36.0)
MCV: 89.8 fL (ref 80.0–100.0)
Platelets: 186 10*3/uL (ref 150–440)
RBC: 3.15 MIL/uL — AB (ref 3.80–5.20)
RDW: 15 % — ABNORMAL HIGH (ref 11.5–14.5)
WBC: 9.1 10*3/uL (ref 3.6–11.0)

## 2018-02-07 MED ORDER — FERROUS SULFATE 325 (65 FE) MG PO TABS: 325.0000 mg | ORAL_TABLET | Freq: Every day | ORAL | 0 refills | Status: AC

## 2018-02-07 MED ORDER — IBUPROFEN 600 MG PO TABS: 600.0000 mg | ORAL_TABLET | Freq: Four times a day (QID) | ORAL | 0 refills | Status: AC | PRN

## 2018-02-07 MED ORDER — ACETAMINOPHEN 325 MG PO TABS: 650.0000 mg | ORAL_TABLET | ORAL | 0 refills | Status: AC | PRN

## 2018-02-07 NOTE — Progress Notes (Signed)
Provided and reviewed discharge paperwork and prescriptions. Used teach back method, and pt verbalized understanding of instructions provided. Pt is to make follow appointment tomorrow for 6 week postpartum check up. Mother to be discharged in wheelchair with infant in car seat, father of baby to transport home.

## 2018-02-07 NOTE — Discharge Instructions (Signed)

## 2019-08-04 ENCOUNTER — Other Ambulatory Visit: Payer: Self-pay

## 2019-08-04 DIAGNOSIS — Z20822 Contact with and (suspected) exposure to covid-19: Secondary | ICD-10-CM | POA: Insufficient documentation

## 2019-08-05 ENCOUNTER — Telehealth: Payer: Self-pay

## 2019-08-05 ENCOUNTER — Telehealth: Payer: Self-pay | Admitting: *Deleted

## 2019-08-05 LAB — NOVEL CORONAVIRUS, NAA: SARS-CoV-2, NAA: NOT DETECTED

## 2019-09-04 IMAGING — US US MFM OB FOLLOW-UP
1 series · 13 of 28 positions shown · non-contrast
Comparison: none

PATIENT INFO:

PERFORMED BY:
SERVICE(S) PROVIDED:
INDICATIONS:
28 weeks gestation of pregnancy
FETAL EVALUATION:
Num Of Fetuses:     1
Fetal Heart         149
Rate(bpm):
Cardiac Activity:   Present
Presentation:       Cephalic
Placenta:           Posterior
AFI Sum(cm)     %Tile       Largest Pocket(cm)
13.75           43
RUQ(cm)       RLQ(cm)       LUQ(cm)        LLQ(cm)
3.57
BIOMETRY:
BPD:      71.4  mm     G. Age:  28w 5d         55  %    CI:        73.41   %    70 - 86
FL/HC:      19.9   %    18.8 -
HC:      264.8  mm     G. Age:  28w 6d         40  %    HC/AC:      1.07        1.05 -
AC:      248.2  mm     G. Age:  29w 0d         70  %    FL/BPD:     73.9   %    71 - 87
FL:       52.8  mm     G. Age:  28w 1d         33  %    FL/AC:      21.3   %    20 - 24
HUM:      49.7  mm     G. Age:  29w 1d         66  %
Est. FW:    8011  gm    2 lb 13 oz      56  %
GESTATIONAL AGE:
U/S Today:     28w 5d                                        EDD:   01/27/18
Best:          28w 1d     Det. By:  U/S  (08/06/17)          EDD:   01/31/18
ANATOMY:
Ventricles:            Normal appearance      Stomach:                Seen
Face:                  Within Normal Limits   Kidneys:                Normal appearance
Heart:                 Normal 4 chamber       Bladder:                Seen
view
RVOT:                  Within Normal Limits   Spine:                  Normal appearance
LVOT:                  Within Normal Limits
CERVIX UTERUS ADNEXA:
Cervix
Length:           5.06  cm.

[Series 1: us mfm ob follow-up · 0.20mm/px · 13 of 53 slices shown]
[im 2/53]
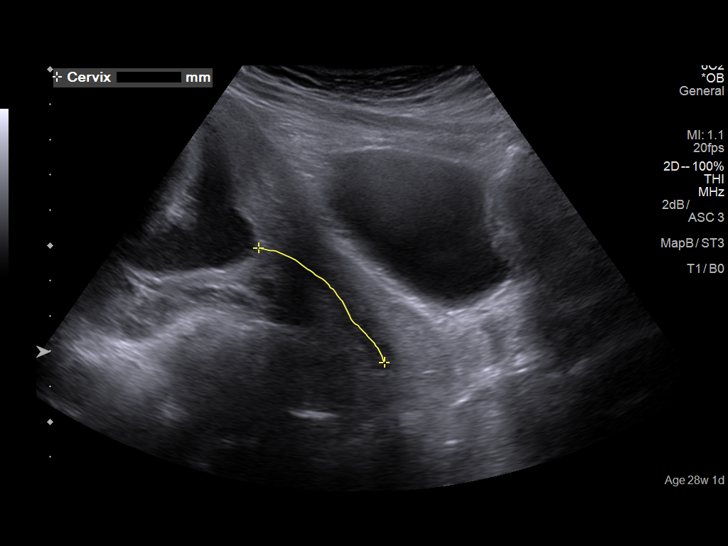
[im 6/53]
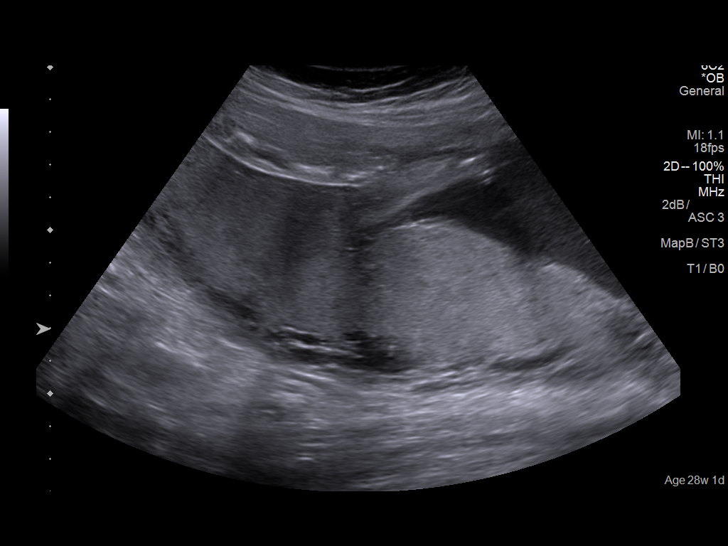
[im 10/53]
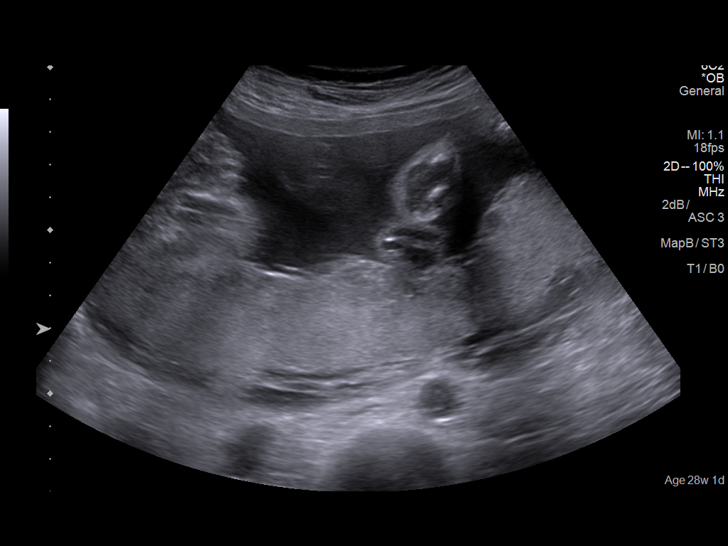
[im 14/53]
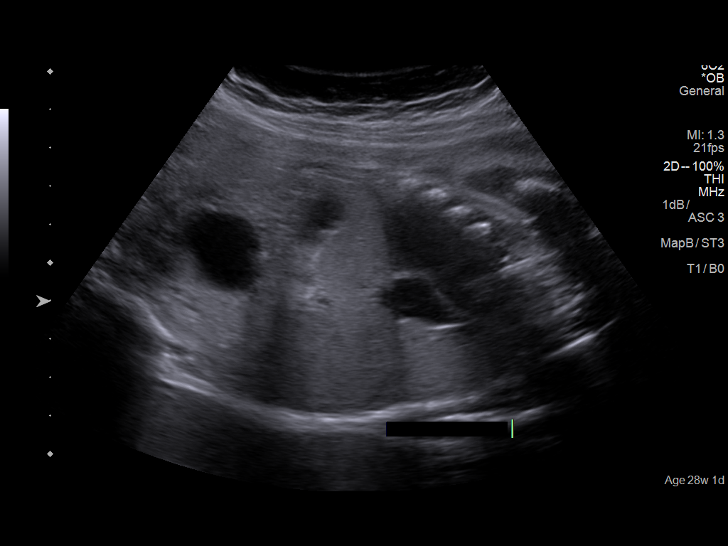
[im 18/53]
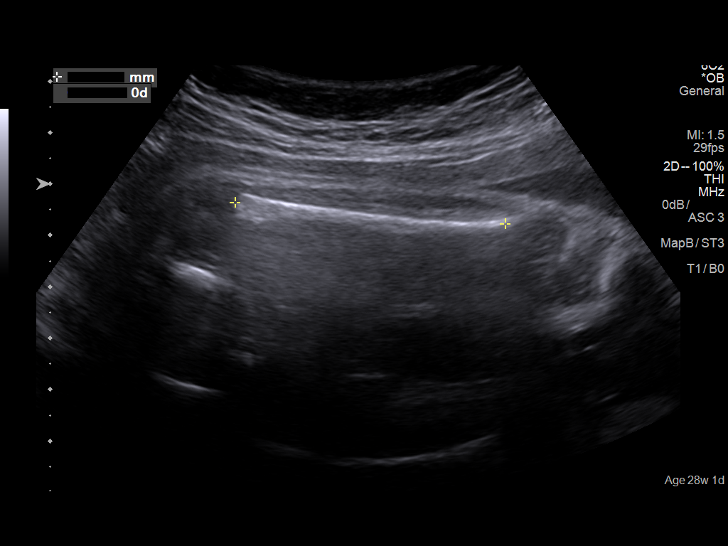
[im 22/53]
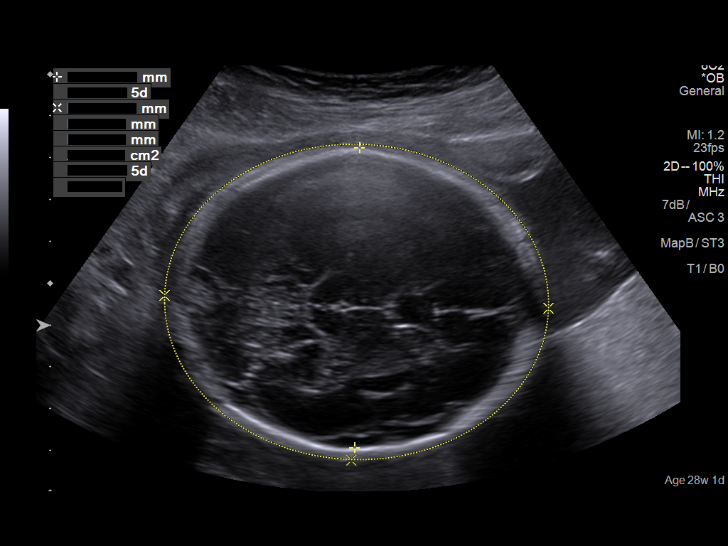
[im 27/53]
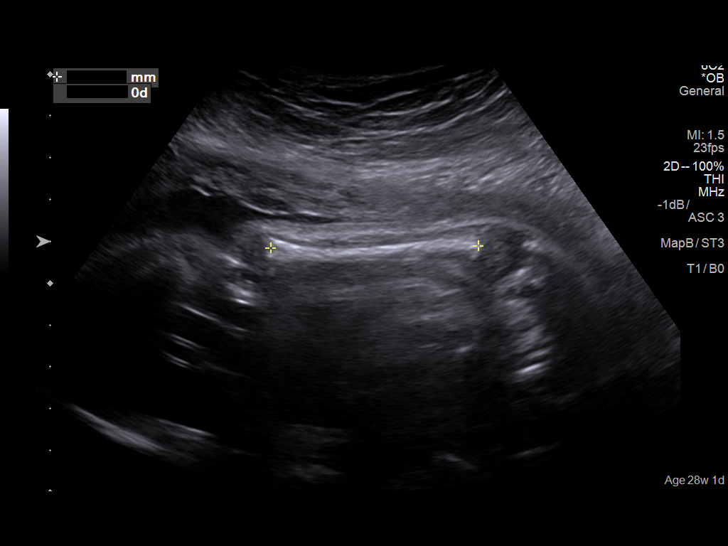
[im 31/53]
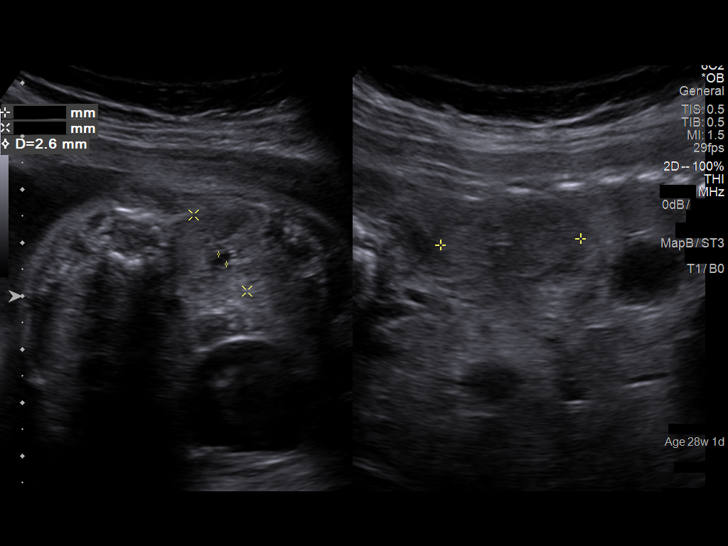
[im 35/53]
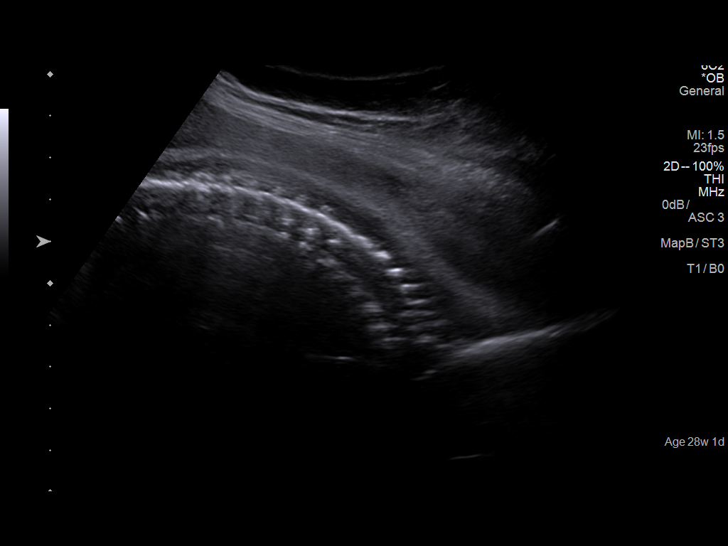
[im 39/53]
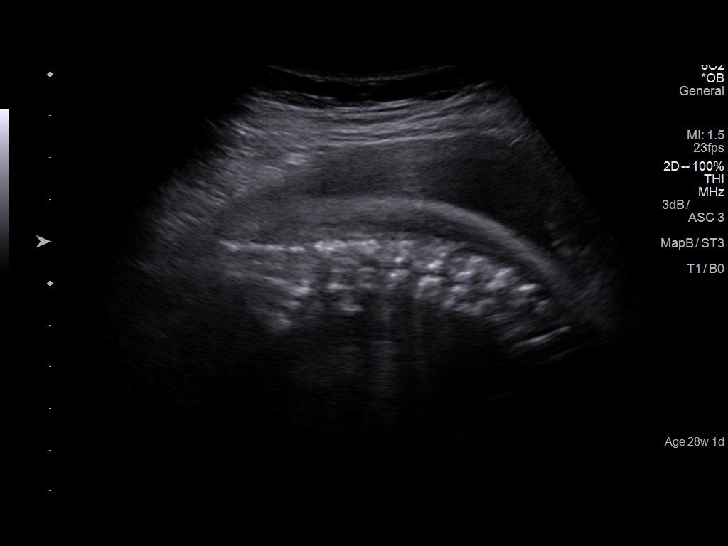
[im 43/53]
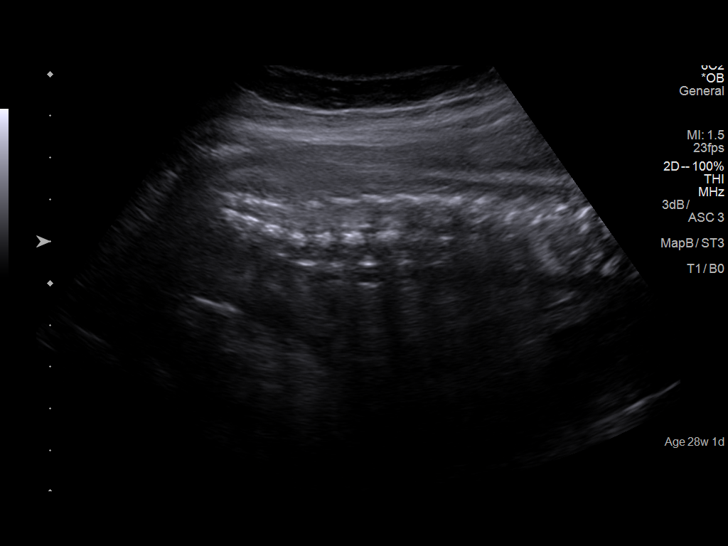
[im 47/53]
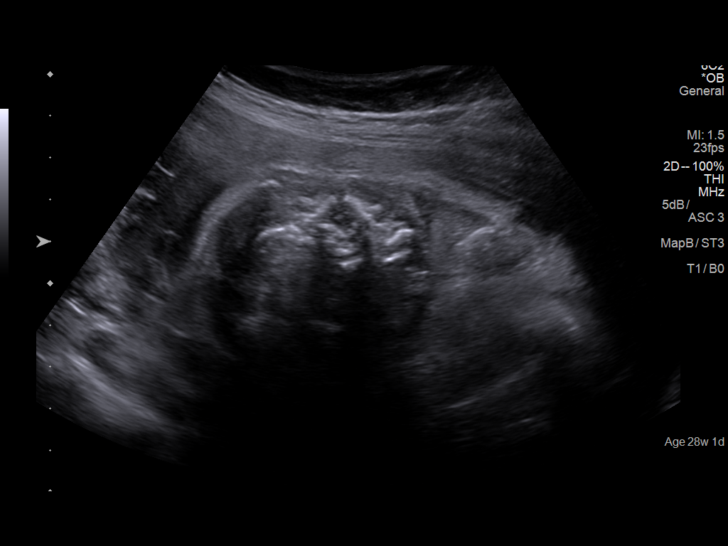
[im 51/53]
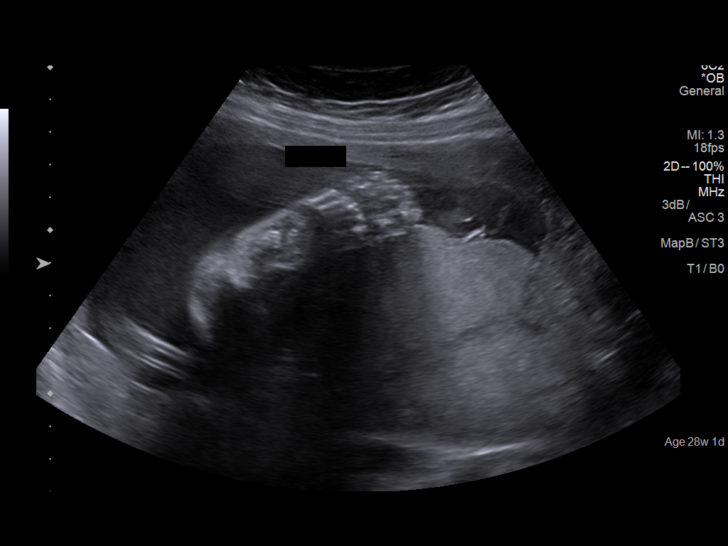

[13 of 28 positions shown; findings below may reference images not displayed]

IMPRESSION: Dear Dr. SEGUN,

Thank you for referring your patient for follwo up fetal growth.

Ultrasound demonstrates a single live pregnancy at 28 weeks
1 day.  Dating is by earliest available ultrasound performed at
[facility] on 08/06/17 with measurements of 14 weeks 4
days.

The amniotic fluid volume is normal and active fetal
movements are seen.

The fetal biometry correlates with established dating.
Adequate interval growth noted.
The estimated fetal weight is at the 56   percentile. EASYBOY.

Recommend follow-up scan for fetal growth as clinically
indicated.

Thank you for allowing us to participate in your patient's care.
assistance.

## 2020-01-03 ENCOUNTER — Ambulatory Visit: Payer: BC Managed Care – PPO | Attending: Internal Medicine

## 2020-01-03 DIAGNOSIS — Z23 Encounter for immunization: Secondary | ICD-10-CM

## 2020-01-24 DIAGNOSIS — Z23 Encounter for immunization: Secondary | ICD-10-CM

## 2020-02-01 ENCOUNTER — Encounter (INDEPENDENT_AMBULATORY_CARE_PROVIDER_SITE_OTHER): Payer: BC Managed Care – PPO | Admitting: Podiatry

## 2020-05-11 ENCOUNTER — Other Ambulatory Visit: Payer: Self-pay | Admitting: Certified Nurse Midwife

## 2020-05-11 DIAGNOSIS — M7989 Other specified soft tissue disorders: Secondary | ICD-10-CM

## 2020-05-11 DIAGNOSIS — M79621 Pain in right upper arm: Secondary | ICD-10-CM

## 2021-07-17 ENCOUNTER — Other Ambulatory Visit: Payer: Self-pay | Admitting: Obstetrics

## 2021-07-17 DIAGNOSIS — Z1231 Encounter for screening mammogram for malignant neoplasm of breast: Secondary | ICD-10-CM

## 2023-10-06 ENCOUNTER — Other Ambulatory Visit: Payer: Self-pay | Admitting: Obstetrics

## 2023-10-06 DIAGNOSIS — Z1231 Encounter for screening mammogram for malignant neoplasm of breast: Secondary | ICD-10-CM

## 2023-10-27 ENCOUNTER — Encounter: Payer: Self-pay | Admitting: Radiology

## 2023-10-27 ENCOUNTER — Ambulatory Visit
Admission: RE | Admit: 2023-10-27 | Discharge: 2023-10-27 | Disposition: A | Discharge status: Home / Self Care | Source: Ambulatory Visit | Attending: Obstetrics | Admitting: Obstetrics

## 2023-10-27 DIAGNOSIS — Z1231 Encounter for screening mammogram for malignant neoplasm of breast: Secondary | ICD-10-CM | POA: Insufficient documentation

## 2023-11-02 ENCOUNTER — Other Ambulatory Visit: Payer: Self-pay | Admitting: Obstetrics

## 2023-11-02 DIAGNOSIS — R928 Other abnormal and inconclusive findings on diagnostic imaging of breast: Secondary | ICD-10-CM

## 2023-11-06 ENCOUNTER — Encounter: Payer: Self-pay | Admitting: Radiology

## 2023-11-10 ENCOUNTER — Ambulatory Visit
Admission: RE | Admit: 2023-11-10 | Discharge: 2023-11-10 | Disposition: A | Discharge status: Home / Self Care | Source: Ambulatory Visit | Attending: Obstetrics | Admitting: Obstetrics

## 2023-11-10 DIAGNOSIS — R928 Other abnormal and inconclusive findings on diagnostic imaging of breast: Secondary | ICD-10-CM | POA: Diagnosis present

## 2024-04-19 ENCOUNTER — Other Ambulatory Visit: Payer: Self-pay | Admitting: Obstetrics

## 2024-04-21 ENCOUNTER — Other Ambulatory Visit: Payer: Self-pay | Admitting: Obstetrics

## 2024-04-21 DIAGNOSIS — N6489 Other specified disorders of breast: Secondary | ICD-10-CM

## 2024-05-17 ENCOUNTER — Ambulatory Visit
Admission: RE | Admit: 2024-05-17 | Discharge: 2024-05-17 | Disposition: A | Discharge status: Home / Self Care | Source: Ambulatory Visit | Attending: Obstetrics | Admitting: Obstetrics

## 2024-05-17 DIAGNOSIS — N6489 Other specified disorders of breast: Secondary | ICD-10-CM | POA: Diagnosis present
